# Patient Record
Sex: Female | Born: 2000 | Race: Black or African American | Hispanic: Yes | Marital: Single | Smoking: Never smoker
Health system: Southern US, Community
[De-identification: ages and names within clinical notes are randomized; demographics above are authoritative.]

## PROBLEM LIST (undated history)

## (undated) ENCOUNTER — Inpatient Hospital Stay: Payer: Self-pay

## (undated) DIAGNOSIS — F32A Depression, unspecified: Secondary | ICD-10-CM

## (undated) DIAGNOSIS — K219 Gastro-esophageal reflux disease without esophagitis: Secondary | ICD-10-CM

## (undated) DIAGNOSIS — F329 Major depressive disorder, single episode, unspecified: Secondary | ICD-10-CM

## (undated) DIAGNOSIS — E669 Obesity, unspecified: Secondary | ICD-10-CM

## (undated) DIAGNOSIS — F419 Anxiety disorder, unspecified: Secondary | ICD-10-CM

---
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
## 2018-02-05 ENCOUNTER — Encounter: Payer: Self-pay | Admitting: Emergency Medicine

## 2018-02-05 ENCOUNTER — Emergency Department
Admission: EM | Admit: 2018-02-05 | Discharge: 2018-02-05 | Disposition: A | Discharge status: Home / Self Care | Attending: Emergency Medicine | Admitting: Emergency Medicine

## 2018-02-05 ENCOUNTER — Other Ambulatory Visit: Payer: Self-pay

## 2018-02-05 DIAGNOSIS — N390 Urinary tract infection, site not specified: Secondary | ICD-10-CM | POA: Diagnosis not present

## 2018-02-05 DIAGNOSIS — R110 Nausea: Secondary | ICD-10-CM

## 2018-02-05 DIAGNOSIS — Z79899 Other long term (current) drug therapy: Secondary | ICD-10-CM | POA: Insufficient documentation

## 2018-02-05 DIAGNOSIS — R112 Nausea with vomiting, unspecified: Secondary | ICD-10-CM | POA: Insufficient documentation

## 2018-02-05 DIAGNOSIS — K219 Gastro-esophageal reflux disease without esophagitis: Secondary | ICD-10-CM | POA: Diagnosis not present

## 2018-02-05 LAB — CBC WITH DIFFERENTIAL/PLATELET
ABS IMMATURE GRANULOCYTES: 0.04 10*3/uL (ref 0.00–0.07)
Basophils Absolute: 0 10*3/uL (ref 0.0–0.1)
Basophils Relative: 0 %
Eosinophils Absolute: 0 10*3/uL (ref 0.0–0.5)
Eosinophils Relative: 1 %
HEMATOCRIT: 41.7 % (ref 36.0–46.0)
HEMOGLOBIN: 12.4 g/dL (ref 12.0–15.0)
Immature Granulocytes: 1 %
LYMPHS PCT: 12 %
Lymphs Abs: 1 10*3/uL (ref 0.7–4.0)
MCH: 21.6 pg — AB (ref 26.0–34.0)
MCHC: 29.7 g/dL — ABNORMAL LOW (ref 30.0–36.0)
MCV: 72.8 fL — AB (ref 80.0–100.0)
MONO ABS: 0.4 10*3/uL (ref 0.1–1.0)
MONOS PCT: 6 %
NEUTROS ABS: 6.5 10*3/uL (ref 1.7–7.7)
Neutrophils Relative %: 80 %
Platelets: 381 10*3/uL (ref 150–400)
RBC: 5.73 MIL/uL — ABNORMAL HIGH (ref 3.87–5.11)
RDW: 17.8 % — ABNORMAL HIGH (ref 11.5–15.5)
WBC: 8.1 10*3/uL (ref 4.0–10.5)
nRBC: 0 % (ref 0.0–0.2)

## 2018-02-05 LAB — CBC

## 2018-02-05 LAB — URINALYSIS, COMPLETE (UACMP) WITH MICROSCOPIC
BACTERIA UA: NONE SEEN
BILIRUBIN URINE: NEGATIVE
Glucose, UA: NEGATIVE mg/dL
HGB URINE DIPSTICK: NEGATIVE
KETONES UR: NEGATIVE mg/dL
NITRITE: NEGATIVE
PH: 5 (ref 5.0–8.0)
Protein, ur: NEGATIVE mg/dL
SPECIFIC GRAVITY, URINE: 1.026 (ref 1.005–1.030)

## 2018-02-05 LAB — COMPREHENSIVE METABOLIC PANEL
ALK PHOS: 96 U/L (ref 38–126)
ALT: 18 U/L (ref 0–44)
AST: 19 U/L (ref 15–41)
Albumin: 3.8 g/dL (ref 3.5–5.0)
Anion gap: 6 (ref 5–15)
BILIRUBIN TOTAL: 0.7 mg/dL (ref 0.3–1.2)
BUN: 12 mg/dL (ref 6–20)
CALCIUM: 8.6 mg/dL — AB (ref 8.9–10.3)
CO2: 26 mmol/L (ref 22–32)
CREATININE: 0.68 mg/dL (ref 0.44–1.00)
Chloride: 104 mmol/L (ref 98–111)
GFR calc Af Amer: >60 mL/min (ref >60)
Glucose, Bld: 105 mg/dL — ABNORMAL HIGH (ref 70–99)
POTASSIUM: 3.9 mmol/L (ref 3.5–5.1)
Sodium: 136 mmol/L (ref 135–145)
TOTAL PROTEIN: 7.7 g/dL (ref 6.5–8.1)

## 2018-02-05 LAB — POCT PREGNANCY, URINE: Preg Test, Ur: NEGATIVE

## 2018-02-05 LAB — LIPASE, BLOOD: Lipase: 30 U/L (ref 11–51)

## 2018-02-05 MED ORDER — HYOSCYAMINE SULFATE 0.125 MG SL SUBL
0.2500 mg | SUBLINGUAL_TABLET | Freq: Once | SUBLINGUAL | Status: AC
  Administered 2018-02-05: 0.25 mg via SUBLINGUAL
  Filled 2018-02-05: qty 2

## 2018-02-05 MED ORDER — ONDANSETRON 4 MG PO TBDP
4.0000 mg | ORAL_TABLET | Freq: Once | ORAL | Status: AC
Start: 2018-02-05 — End: 2018-02-05
  Administered 2018-02-05: 4 mg via ORAL
  Filled 2018-02-05: qty 1

## 2018-02-05 MED ORDER — SULFAMETHOXAZOLE-TRIMETHOPRIM 800-160 MG PO TABS: 1.0000 | ORAL_TABLET | Freq: Two times a day (BID) | ORAL | 0 refills | Status: AC

## 2018-02-05 MED ORDER — LIDOCAINE VISCOUS HCL 2 % MT SOLN
15.0000 mL | Freq: Once | OROMUCOSAL | Status: AC
  Administered 2018-02-05: 15 mL via ORAL
  Filled 2018-02-05: qty 15

## 2018-02-05 MED ORDER — ONDANSETRON 4 MG PO TBDP: 4.0000 mg | ORAL_TABLET | Freq: Three times a day (TID) | ORAL | 0 refills | Status: AC | PRN

## 2018-02-05 MED ORDER — ESOMEPRAZOLE MAGNESIUM 40 MG PO CPDR: 40.0000 mg | DELAYED_RELEASE_CAPSULE | Freq: Every day | ORAL | 1 refills | Status: AC

## 2018-02-05 MED ORDER — SODIUM CHLORIDE 0.9% FLUSH: 3.0000 mL | Freq: Once | INTRAVENOUS | Status: DC

## 2018-02-05 MED ORDER — ALUM & MAG HYDROXIDE-SIMETH 200-200-20 MG/5ML PO SUSP
30.0000 mL | Freq: Once | ORAL | Status: AC
  Administered 2018-02-05: 30 mL via ORAL
  Filled 2018-02-05: qty 30

## 2018-02-05 NOTE — ED Provider Notes (Addendum)
[facility] Emergency Department Provider Note   ____________________________________________   First MD Initiated Contact with Patient 02/05/18 1154     (approximate)  I have reviewed the triage vital signs and the nursing notes.   HISTORY  Chief Complaint Abdominal Pain   HPI Megan [name] is a 18 y.o. female who reports being sick for a week.  She started with diarrhea and some nausea and vomiting.  Diarrhea is gone now.  She complains of some mid abdominal and epigastric pain and pain in the left CVA area.  Pain is made worse with palpation percussion.  Nothing else really seems to make it worse.  She also reports vomiting especially in the morning.  Is dysuria urgency or frequency in fact she says she is urinating less than usual.  She is not having a fever.   Past Medical History:  Diagnosis Date  . Acid reflux   . Anxiety   . Depression   . Obesity (BMI 35.0-39.9 without comorbidity)     Patient Active Problem List   Diagnosis Date Noted  . Menorrhagia with irregular cycle 10/10/2017  . Contraceptive management 10/10/2017  . Status post cesarean section 08/19/2017  . PROM (premature rupture of membranes) 08/18/2017  . Indication for care in labor and delivery, antepartum 08/14/2017  . Labor and delivery indication for care or intervention 08/13/2017  . Vaginal bleeding in pregnancy, third trimester 08/05/2017  . Spotting affecting pregnancy in third trimester 07/11/2017  . Abdominal cramping affecting pregnancy 06/19/2017  . Supervision of high risk pregnancy, antepartum 01/12/2017  . Obesity affecting pregnancy 01/12/2017  . BMI 35.0-35.9,adult 01/12/2017    Past Surgical History:  Procedure Laterality Date  . CESAREAN SECTION N/A 08/19/2017   Procedure: CESAREAN SECTION;  Surgeon: Conard Novak, MD;  Location: [facility] ORS;  Service: Obstetrics;  Laterality: N/A;    Prior to Admission medications   Medication Sig Start Date End  Date Taking? Authorizing Provider  esomeprazole (NEXIUM) 40 MG capsule Take 1 capsule (40 mg total) by mouth daily. 02/05/18 02/05/19  Arnaldo Natal, MD  folic acid (FOLVITE) 800 MCG tablet Take 400 mcg by mouth daily.    [provider]  loratadine (CLARITIN) 10 MG tablet Take 10 mg by mouth daily.    [provider]  norethindrone (MICRONOR,CAMILA,ERRIN) 0.35 MG tablet Take 1 tablet (0.35 mg total) by mouth daily. 08/22/17   Schuman, Jaquelyn Bitter, MD  ondansetron (ZOFRAN ODT) 4 MG disintegrating tablet Take 1 tablet (4 mg total) by mouth every 8 (eight) hours as needed for nausea or vomiting. 02/05/18   Arnaldo Natal, MD  Prenatal Vit-Fe Fumarate-FA (MULTIVITAMIN-PRENATAL) 27-0.8 MG TABS tablet Take 1 tablet by mouth daily at 12 noon.    [provider]  sulfamethoxazole-trimethoprim (BACTRIM DS,SEPTRA DS) 800-160 MG tablet Take 1 tablet by mouth 2 (two) times daily. 02/05/18   Arnaldo Natal, MD    Allergies Penicillins; Amoxicillin; and Ibuprofen  Family History  Problem Relation Age of Onset  . Heart disease Maternal Grandmother     Social History Social History   Tobacco Use  . Smoking status: Never Smoker  . Smokeless tobacco: Never Used  Substance Use Topics  . Alcohol use: Not Currently    Comment: occasionally  . Drug use: No    Review of Systems  Constitutional: No fever/chills Eyes: No visual changes. ENT: No sore throat. Cardiovascular: Denies chest pain. Respiratory: Denies shortness of breath. Gastrointestinal: See HPI Genitourinary: Negative for dysuria.  Musculoskeletal: Negative for back pain. Skin: Negative for rash. Neurological: Negative for headaches, focal weakness   ____________________________________________   PHYSICAL EXAM:  VITAL SIGNS: ED Triage Vitals  Enc Vitals Group     BP 02/05/18 0944 116/83     Pulse Rate 02/05/18 0944 99     Resp 02/05/18 0944 20     Temp 02/05/18 0944 99.4 F (37.4 C)     Temp  Source 02/05/18 0944 Oral     SpO2 02/05/18 0944 98 %     Weight 02/05/18 0945 225 lb (102.1 kg)     Height 02/05/18 0945 5\' 6"  (1.676 m)     Head Circumference --      Peak Flow --      Pain Score 02/05/18 0948 7     Pain Loc --      Pain Edu? --      Excl. in GC? --     Constitutional: Alert and oriented. Well appearing and in no acute distress. Eyes: Conjunctivae are normal.  Head: Atraumatic. Nose: No congestion/rhinnorhea. Mouth/Throat: Mucous membranes are moist.  Oropharynx non-erythematous. Neck: No stridor.   Cardiovascular: Normal rate, regular rhythm. Grossly normal heart sounds.  Good peripheral circulation. Respiratory: Normal respiratory effort.  No retractions. Lungs CTAB. Gastrointestinal: Soft tender in the epigastric area just above the umbilicus to palpation slightly to percussion.. No distention. No abdominal bruits. No CVA tenderness.  No suprapubic tenderness Musculoskeletal: No lower extremity tenderness nor edema.  No joint effusions. Neurologic:  Normal speech and language. No gross focal neurologic deficits are appreciated. No gait instability. Skin:  Skin is warm, dry and intact. No rash noted. Psychiatric: Mood and affect are normal. Speech and behavior are normal.  ____________________________________________   LABS (all labs ordered are listed, but only abnormal results are displayed)  Labs Reviewed  COMPREHENSIVE METABOLIC PANEL - Abnormal; Notable for the following components:      Result Value   Glucose, Bld 105 (*)    Calcium 8.6 (*)    All other components within normal limits  URINALYSIS, COMPLETE (UACMP) WITH MICROSCOPIC - Abnormal; Notable for the following components:   Color, Urine YELLOW (*)    APPearance CLOUDY (*)    Leukocytes, UA MODERATE (*)    All other components within normal limits  CBC WITH DIFFERENTIAL/PLATELET - Abnormal; Notable for the following components:   RBC 5.73 (*)    MCV 72.8 (*)    MCH 21.6 (*)    MCHC 29.7  (*)    RDW 17.8 (*)    All other components within normal limits  LIPASE, BLOOD  CBC  POC URINE PREG, ED  POCT PREGNANCY, URINE   ____________________________________________  EKG   ____________________________________________  RADIOLOGY  ED MD interpretation:   Official radiology report(s): No results found.  ____________________________________________   PROCEDURES  Procedure(s) performed:   Procedures  Critical Care performed:   ____________________________________________   INITIAL IMPRESSION / ASSESSMENT AND PLAN / ED COURSE   Patient's pain resolved with GI cocktail.  She is not vomiting.  We will give her a Zofran ODT for the nausea she still has and have her do clear liquids and then brat diet before returning to regular food.  She will return here if she is worse at all or cannot keep things down.  We will use Bactrim DS 1 twice a day for her UTI.  She does have CVA tenderness but no fever no white count not sure that she has pyelonephritis but we  will give her 10 days worth of the Bactrim just in case.  She is breast-feeding now but is going to stop this.  She says she has been weaning her child off and is ready to stop now.         ____________________________________________   FINAL CLINICAL IMPRESSION(S) / ED DIAGNOSES  Final diagnoses:  Nausea  Gastroesophageal reflux disease, esophagitis presence not specified  Urinary tract infection without hematuria, site unspecified     ED Discharge Orders         Ordered    ondansetron (ZOFRAN ODT) 4 MG disintegrating tablet  Every 8 hours PRN     02/05/18 1239    esomeprazole (NEXIUM) 40 MG capsule  Daily     02/05/18 1239    sulfamethoxazole-trimethoprim (BACTRIM DS,SEPTRA DS) 800-160 MG tablet  2 times daily     02/05/18 1244           Note:  This document was prepared using Dragon voice recognition software and may include unintentional dictation errors.    Arnaldo NatalMalinda, Jackson Fetters F,  MD 02/05/18 1239    Arnaldo NatalMalinda, [place] F, MD 02/05/18 1246

## 2018-02-05 NOTE — Discharge Instructions (Addendum)
Use the Zofran melt on your tongue wafers 1 3 times a day as needed for nausea.  Take the Nexium once a day for the upset stomach.  If that is too expensive the pharmacy call us while you are there we can change it to something else.  Return here for increasing pain fever or if you cannot keep anything down if you feel worse at all.  Also return if your not any better 2 or 3 days or see your doctor.  Take the  bactrim antibiotic 1 pill 2 times a day plenty of fluids for the UTI.

## 2018-02-05 NOTE — Progress Notes (Signed)
   02/05/18 1400  Clinical Encounter Type  Visited With Patient  Visit Type Initial;Spiritual support   Chaplain visited with the patient after encountering her in the hallway. She was awaiting discharge after treatment for stomach pain. Patient is a new mom of a 38 month old son and her husband is active duty Hotel manager [employer]/textile technologist) in [place]. They will be reunited in March when she and her infant son will move to [place] to be with her husband. Patient shared that she looks forward to that opportunity for a number of reasons. She self identifies as religious and hopes to raise her son in a faith-based/filled home environment and community. Patient wants to determine how to best talk with her spouse in order to share that desire and facilitate growth of their family and relationship in faith. Chaplain provided some suggestions for exploring this with her spouse.

## 2018-02-05 NOTE — ED Triage Notes (Signed)
Lower abd pain x 1 week. Nauseated.

## 2018-02-27 IMAGING — US US ART/VEN ABD/PELV/SCROTUM DOPPLER LTD
1 series · 14 of 25 positions shown · non-contrast
Comparison: No recent .

CLINICAL DATA: Pelvic pain.

EXAM:
TRANSABDOMINAL AND TRANSVAGINAL ULTRASOUND OF PELVIS
DOPPLER ULTRASOUND OF OVARIES
TECHNIQUE: Both transabdominal and transvaginal ultrasound examinations of the
pelvis were performed. Transabdominal technique was performed for
global imaging of the pelvis including uterus, ovaries, adnexal
regions, and pelvic cul-de-sac.
It was necessary to proceed with endovaginal exam following the
transabdominal exam to visualize the uterus and ovaries. Color and
duplex Doppler ultrasound was utilized to evaluate blood flow to the
ovaries.

[Series 1: us art/ven abd/pelv/scrotum doppler ltd · 0.22mm/px · 14 of 115 slices shown]
[im 1/115]
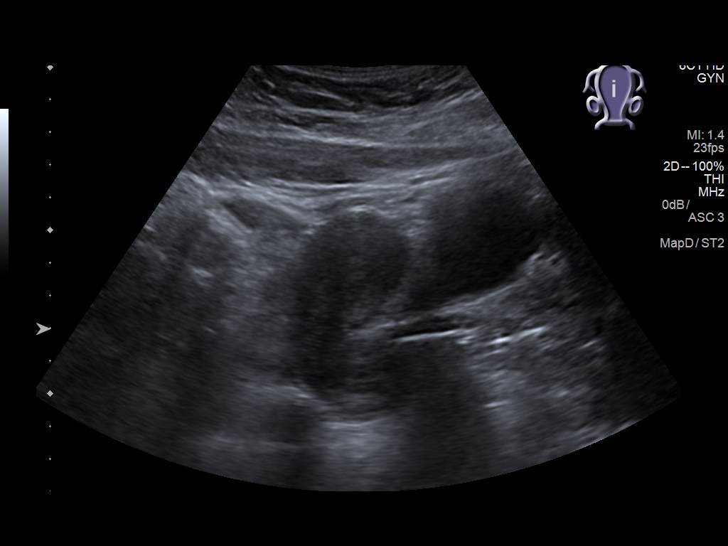
[im 10/115]
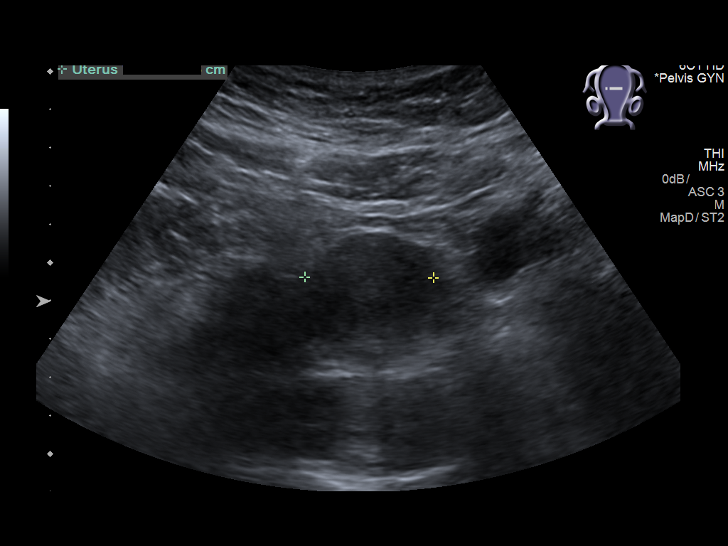
[im 20/115]
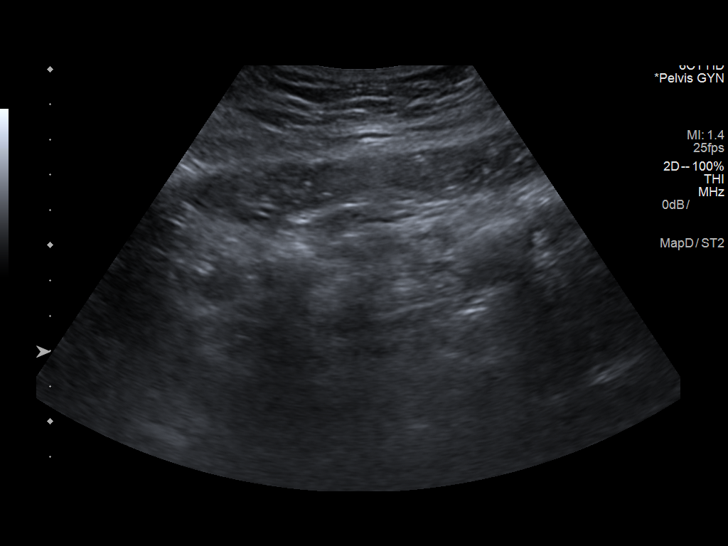
[im 29/115]
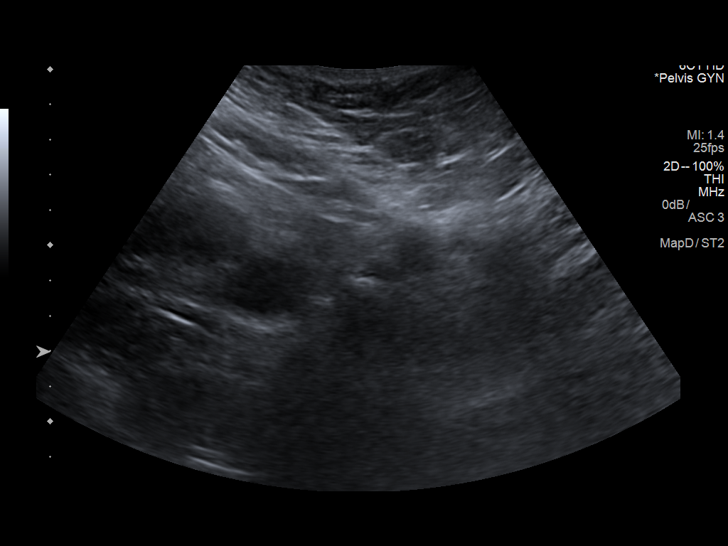
[im 39/115]
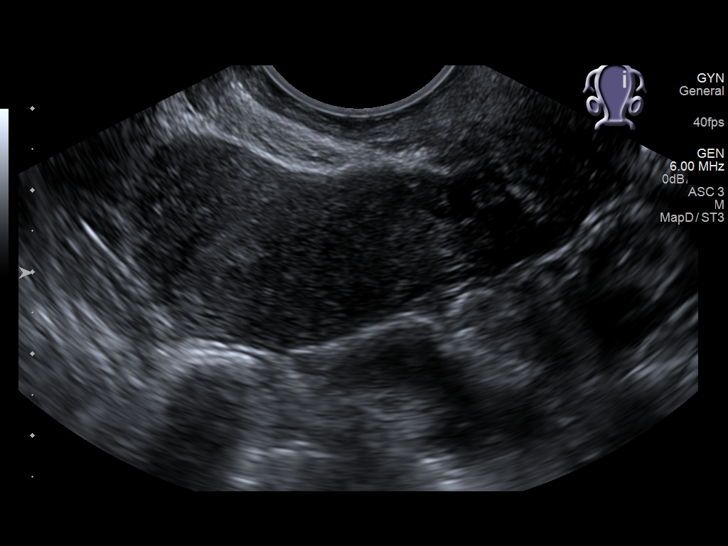
[im 43/115]
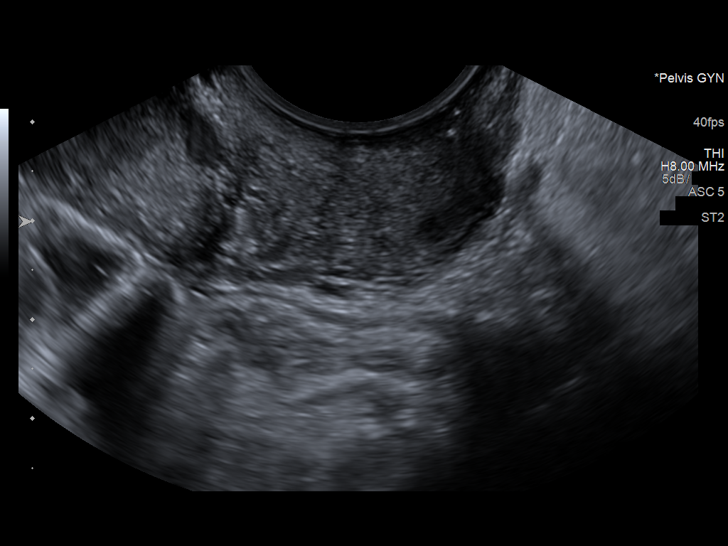
[im 53/115]
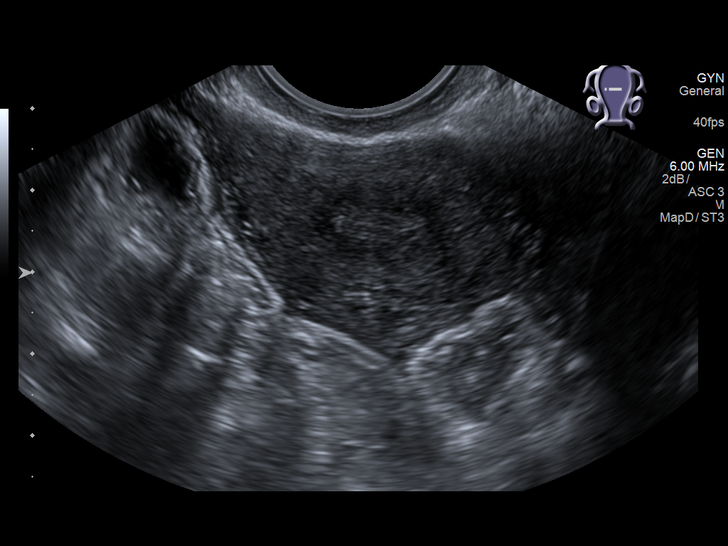
[im 62/115]
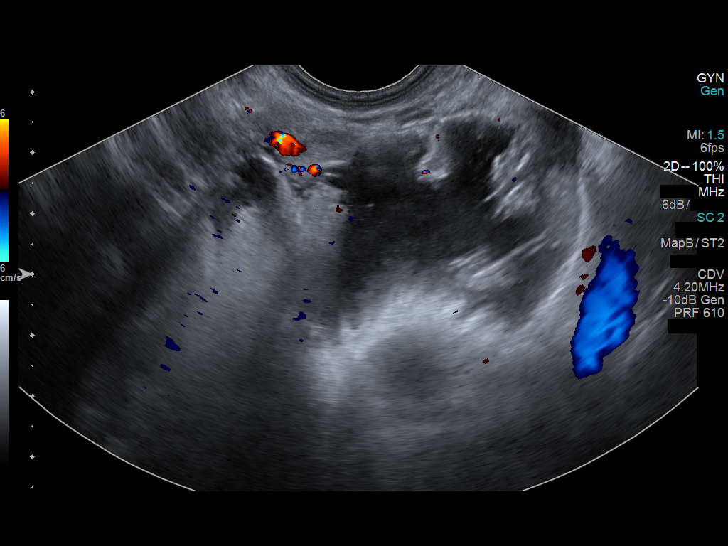
[im 72/115]
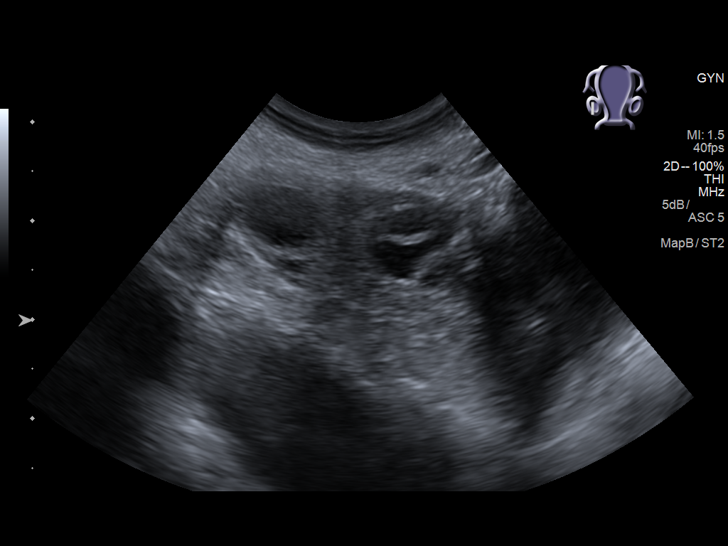
[im 77/115]
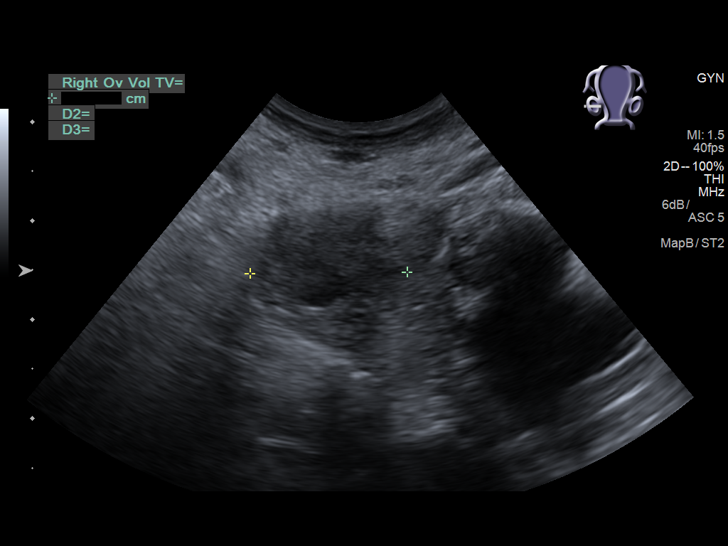
[im 86/115]
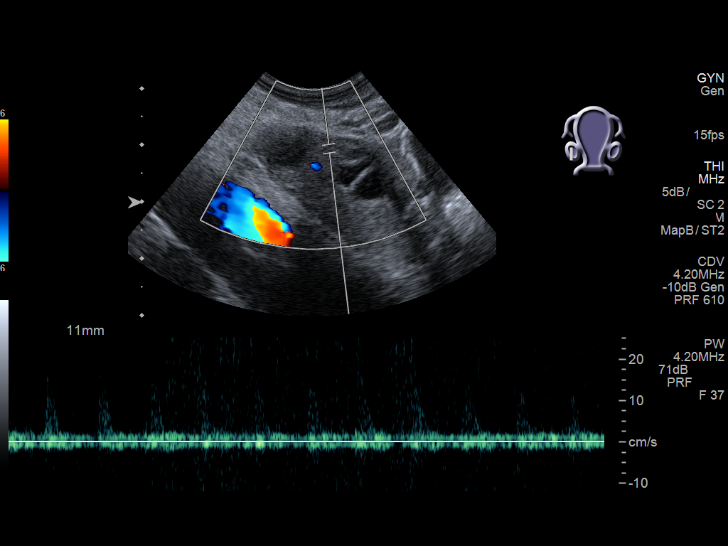
[im 96/115]
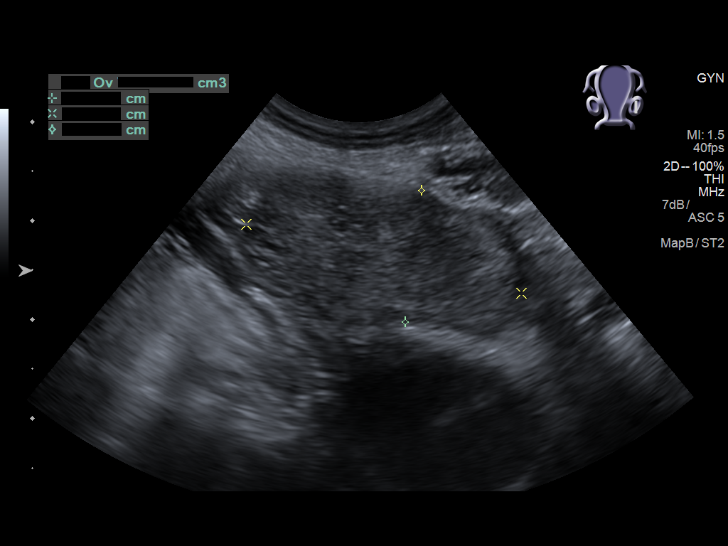
[im 105/115]
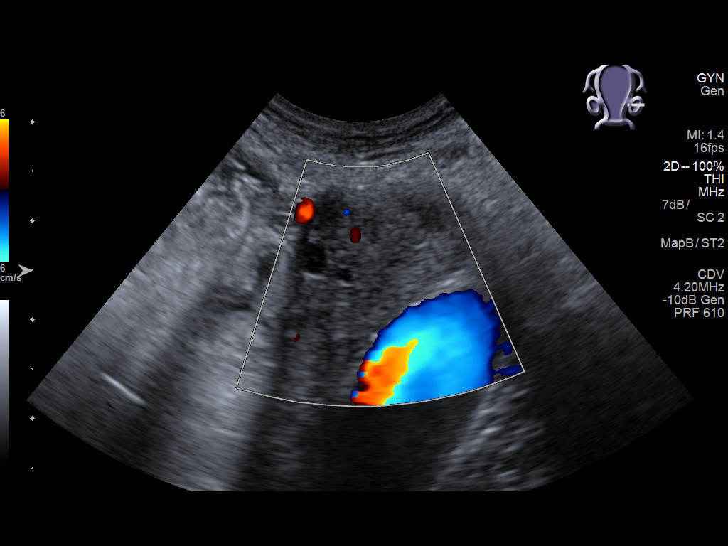
[im 115/115]
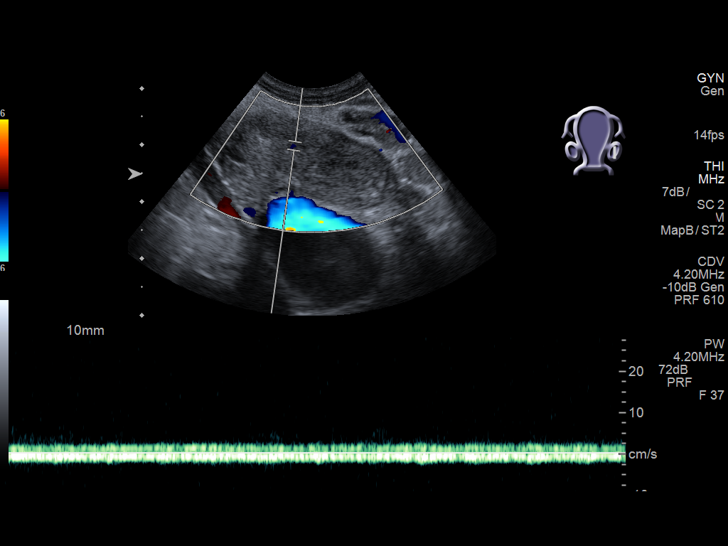

[14 of 25 positions shown; findings below may reference images not displayed]

FINDINGS: Uterus

Measurements: 6.5 x 2.5 x 4.0 cm. No fibroids or other mass
visualized.

Endometrium

Thickness: 4.1 mm.  No focal abnormality visualized.

Right ovary

Measurements: 3.0 x 1.8 x 1.6 cm. Normal appearance/no adnexal mass.

Left ovary

Measurements: 2.9 x 1.3 x 1.8 cm. Normal appearance/no adnexal mass.

Pulsed Doppler evaluation of both ovaries demonstrates normal
low-resistance arterial and venous waveforms.

Other findings

No abnormal free fluid.
IMPRESSION: No acute or focal abnormality.

## 2018-04-21 IMAGING — US US OB COMP LESS 14 WK
1 series · 14 of 28 positions shown · non-contrast
Comparison: None.

CLINICAL DATA: Pregnant patient with cramping and nausea for 3
days. Quantitative HCG 646

EXAM:
OBSTETRIC <14 WK US AND TRANSVAGINAL OB US
TECHNIQUE: Both transabdominal and transvaginal ultrasound examinations were
performed for complete evaluation of the gestation as well as the
maternal uterus, adnexal regions, and pelvic cul-de-sac.
Transvaginal technique was performed to assess early pregnancy.

[Series 1: us ob comp less 14 wk · 0.19mm/px · 14 of 90 slices shown]
[im 4/90]
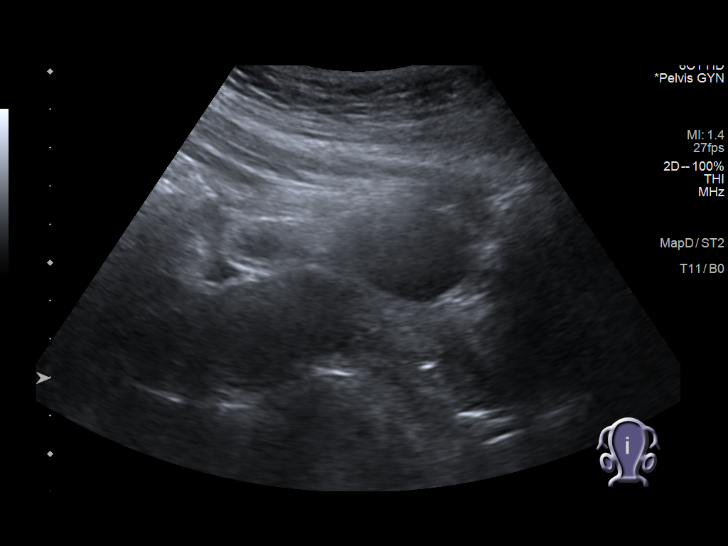
[im 10/90]
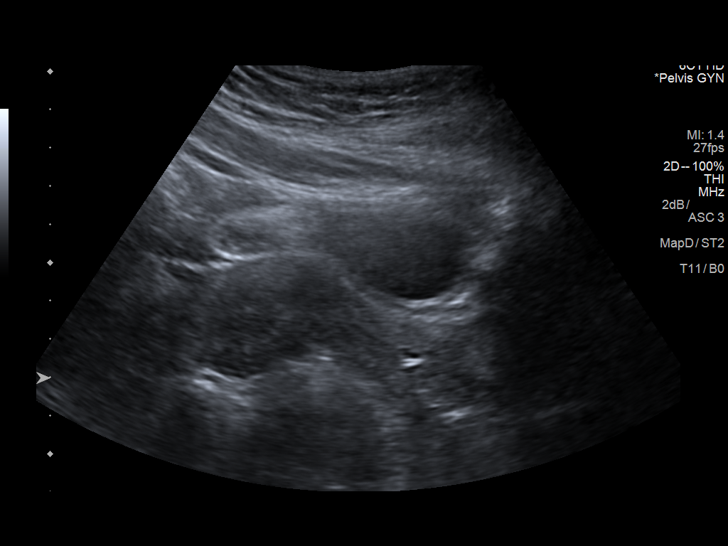
[im 17/90]
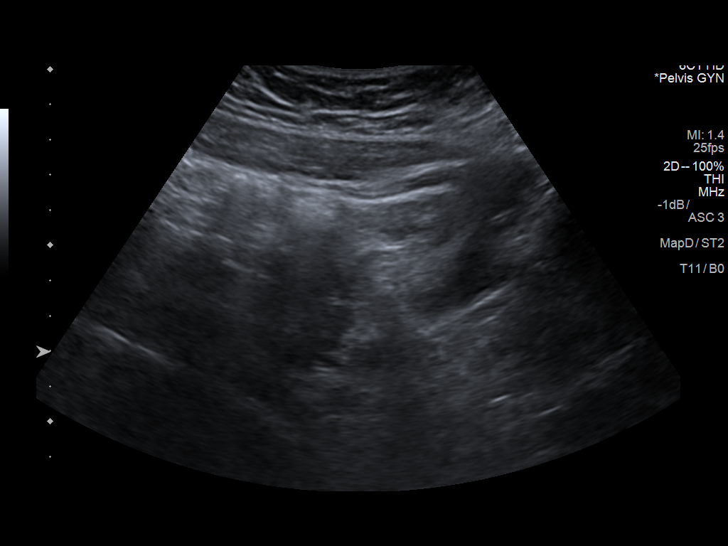
[im 24/90]
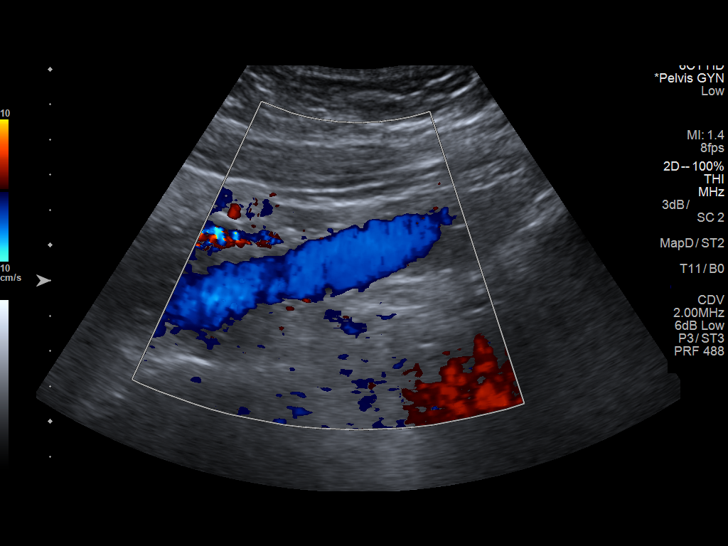
[im 30/90]
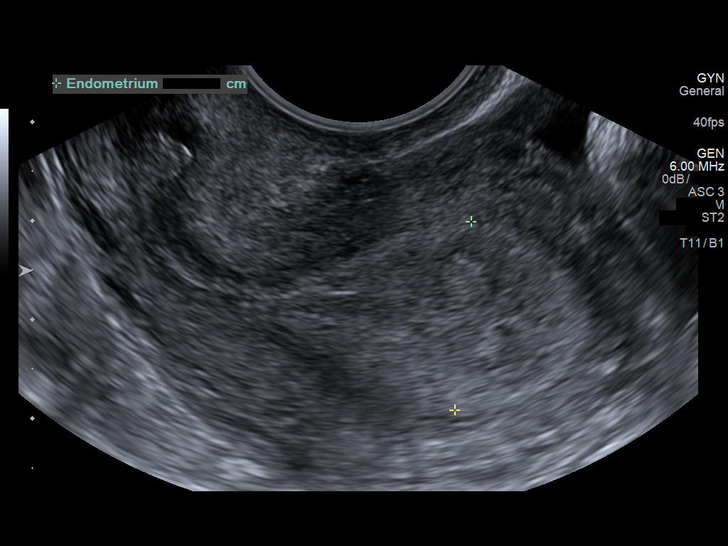
[im 37/90]
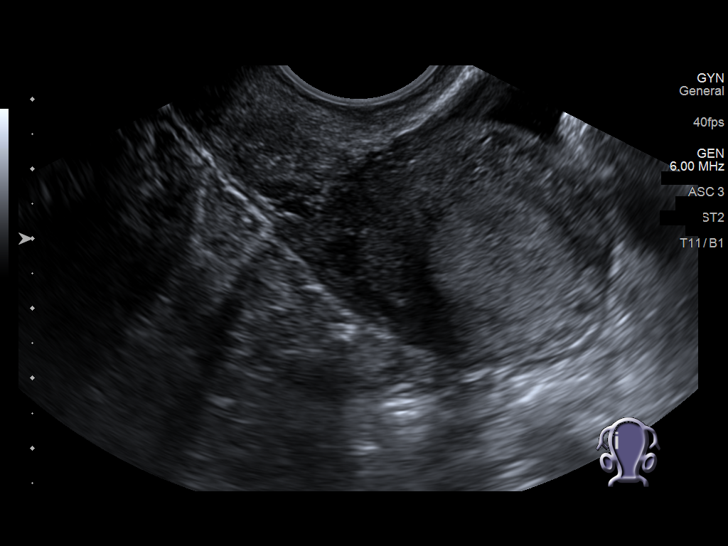
[im 43/90]
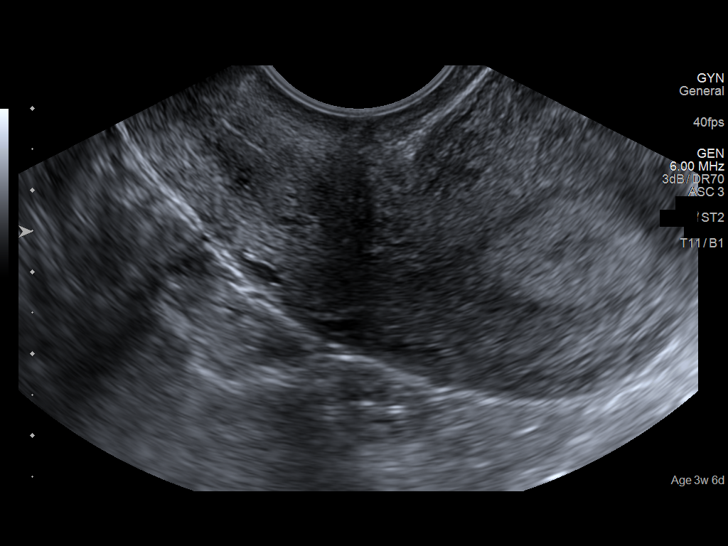
[im 50/90]
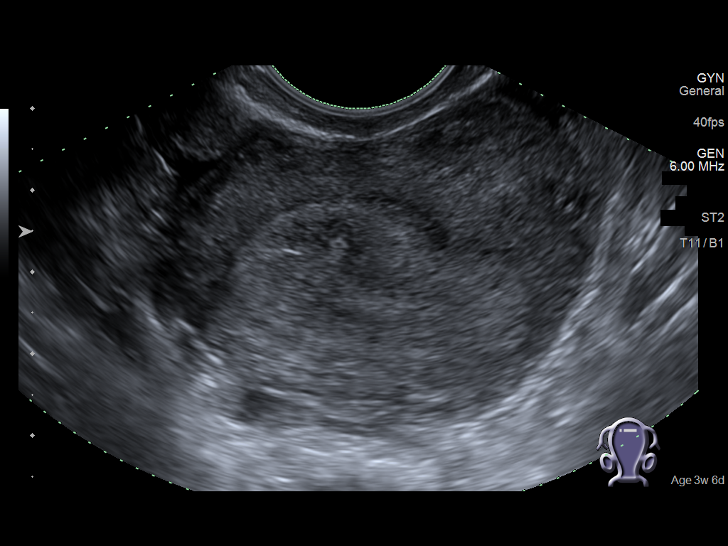
[im 57/90]
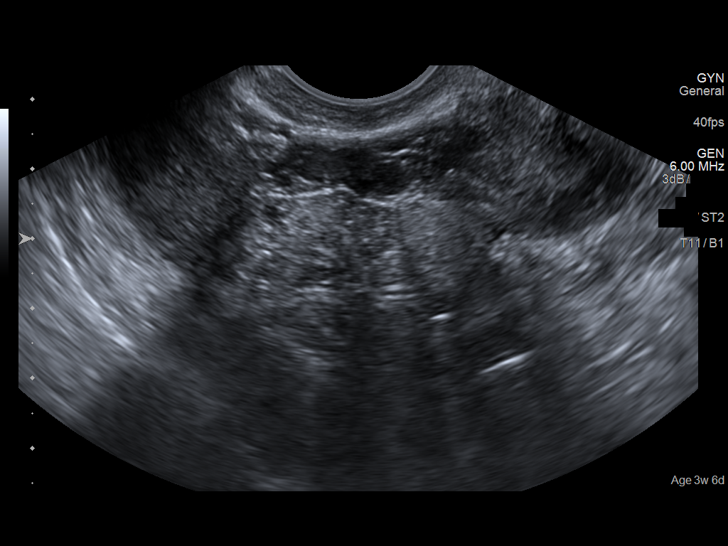
[im 63/90]
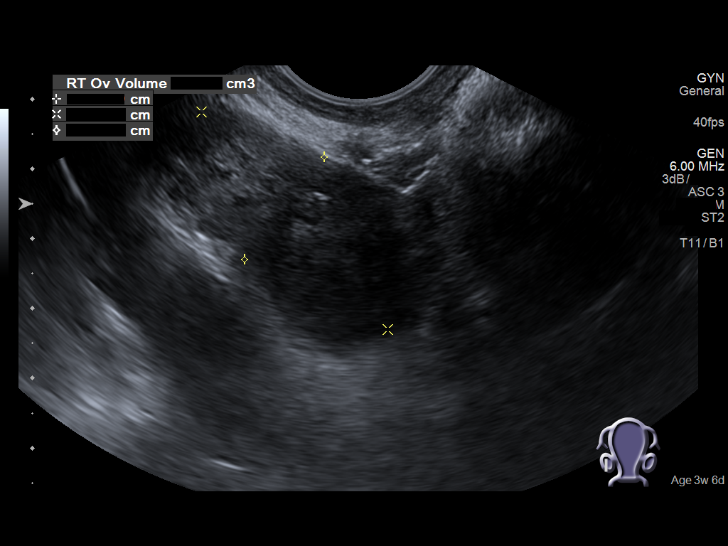
[im 70/90]
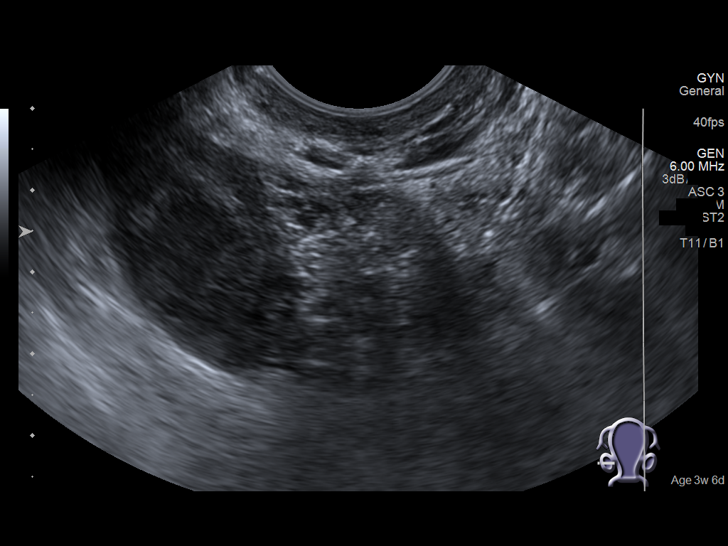
[im 76/90]
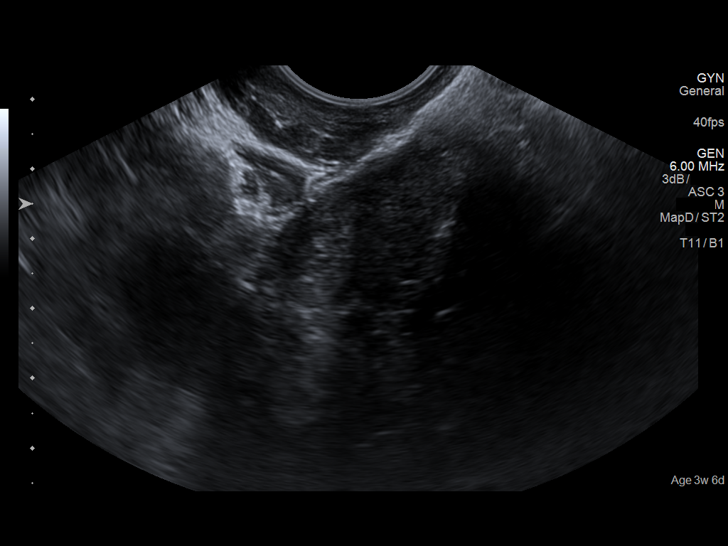
[im 83/90]
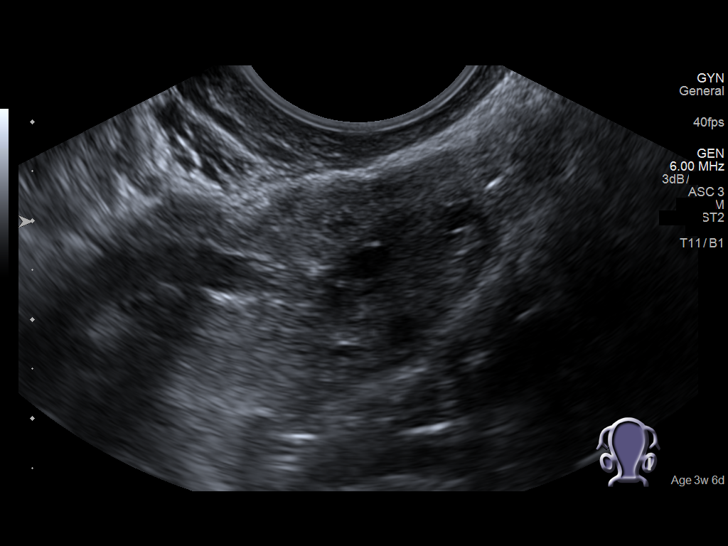
[im 90/90]
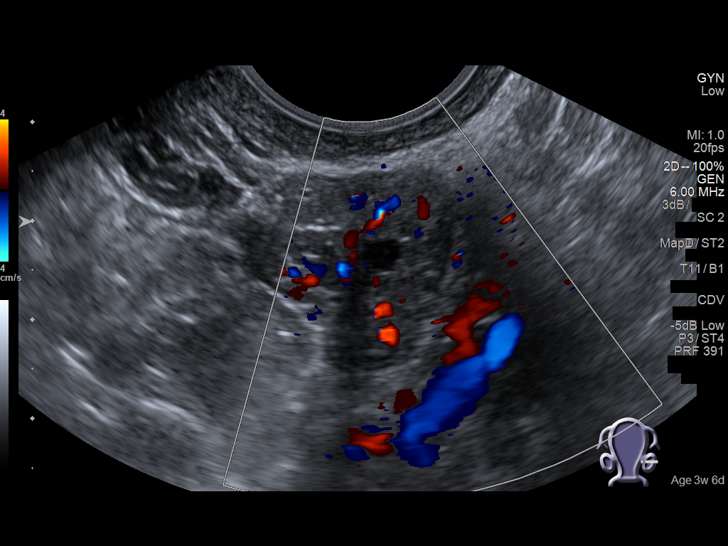

[14 of 28 positions shown; findings below may reference images not displayed]

FINDINGS: Intrauterine gestational sac: Single visualized.

Yolk sac:  Not visualized.

Embryo:  Not visualized.

Cardiac Activity: Not applicable.

MSD: 2.5  mm   5 w   0  d

Subchorionic hemorrhage:  None visualized.

Maternal uterus/adnexae: Likely corpus luteum cyst on the right
noted. Trace amount of free pelvic fluid is seen.
IMPRESSION: Probable early intrauterine gestational sac, but no yolk sac, fetal
pole, or cardiac activity yet visualized. Recommend follow-up
quantitative B-HCG levels and follow-up US in 14 days to assess
viability. This recommendation follows SRU consensus guidelines:
Diagnostic Criteria for Nonviable Pregnancy Early in the First
Trimester. N Engl J Med 9460; [DATE].

## 2018-08-30 ENCOUNTER — Other Ambulatory Visit: Payer: Self-pay | Admitting: Obstetrics and Gynecology
